# Patient Record
Sex: Male | Born: 1975 | Race: White | Hispanic: No | Marital: Married | Smoking: Never smoker
Health system: Southern US, Community
[De-identification: ages and names within clinical notes are randomized; demographics above are authoritative.]

## PROBLEM LIST (undated history)

## (undated) DIAGNOSIS — N200 Calculus of kidney: Secondary | ICD-10-CM

## (undated) HISTORY — PX: FRACTURE SURGERY: SHX138

---

## 2000-01-16 ENCOUNTER — Emergency Department (HOSPITAL_COMMUNITY): Admission: EM | Admit: 2000-01-16 | Discharge: 2000-01-16 | Payer: Self-pay | Admitting: Emergency Medicine

## 2005-06-27 ENCOUNTER — Emergency Department (HOSPITAL_COMMUNITY): Admission: EM | Admit: 2005-06-27 | Discharge: 2005-06-27 | Payer: Self-pay | Admitting: Emergency Medicine

## 2006-06-03 ENCOUNTER — Emergency Department (HOSPITAL_COMMUNITY): Admission: EM | Admit: 2006-06-03 | Discharge: 2006-06-03 | Payer: Self-pay | Admitting: Emergency Medicine

## 2007-07-04 ENCOUNTER — Emergency Department: Payer: Self-pay | Admitting: Emergency Medicine

## 2014-02-09 ENCOUNTER — Ambulatory Visit: Payer: Self-pay | Admitting: Medical

## 2014-04-24 ENCOUNTER — Emergency Department: Payer: Self-pay | Admitting: Emergency Medicine

## 2014-06-09 ENCOUNTER — Emergency Department: Payer: Self-pay | Admitting: Emergency Medicine

## 2014-06-09 LAB — URINALYSIS, COMPLETE
Bacteria: NONE SEEN
Bilirubin,UR: NEGATIVE
Glucose,UR: NEGATIVE mg/dL (ref 0–75)
Leukocyte Esterase: NEGATIVE
Nitrite: NEGATIVE
Ph: 5 (ref 4.5–8.0)
Protein: NEGATIVE
RBC,UR: 249 /HPF (ref 0–5)
Specific Gravity: 1.028 (ref 1.003–1.030)
Squamous Epithelial: <1
WBC UR: 2 /HPF (ref 0–5)

## 2014-06-09 LAB — CBC WITH DIFFERENTIAL/PLATELET
Basophil #: 0 10*3/uL (ref 0.0–0.1)
Basophil %: 0.5 %
Eosinophil #: 0.1 10*3/uL (ref 0.0–0.7)
Eosinophil %: 1 %
HCT: 43.8 % (ref 40.0–52.0)
HGB: 14.5 g/dL (ref 13.0–18.0)
Lymphocyte #: 1.6 10*3/uL (ref 1.0–3.6)
Lymphocyte %: 20.6 %
MCH: 30.3 pg (ref 26.0–34.0)
MCHC: 33 g/dL (ref 32.0–36.0)
MCV: 92 fL (ref 80–100)
Monocyte #: 0.4 x10 3/mm (ref 0.2–1.0)
Monocyte %: 4.6 %
Neutrophil #: 5.7 10*3/uL (ref 1.4–6.5)
Neutrophil %: 73.3 %
Platelet: 138 10*3/uL — ABNORMAL LOW (ref 150–440)
RBC: 4.77 10*6/uL (ref 4.40–5.90)
RDW: 12.8 % (ref 11.5–14.5)
WBC: 7.7 10*3/uL (ref 3.8–10.6)

## 2014-06-09 LAB — COMPREHENSIVE METABOLIC PANEL
Albumin: 4.1 g/dL (ref 3.4–5.0)
Alkaline Phosphatase: 63 U/L
Anion Gap: 6 — ABNORMAL LOW (ref 7–16)
BUN: 18 mg/dL (ref 7–18)
Bilirubin,Total: 0.6 mg/dL (ref 0.2–1.0)
Calcium, Total: 8.9 mg/dL (ref 8.5–10.1)
Chloride: 104 mmol/L (ref 98–107)
Co2: 31 mmol/L (ref 21–32)
Creatinine: 1.25 mg/dL (ref 0.60–1.30)
EGFR (African American): >60
EGFR (Non-African Amer.): >60
Glucose: 128 mg/dL — ABNORMAL HIGH (ref 65–99)
Osmolality: 285 (ref 275–301)
Potassium: 4.1 mmol/L (ref 3.5–5.1)
SGOT(AST): 39 U/L — ABNORMAL HIGH (ref 15–37)
SGPT (ALT): 45 U/L
Sodium: 141 mmol/L (ref 136–145)
Total Protein: 7.2 g/dL (ref 6.4–8.2)

## 2015-02-28 ENCOUNTER — Emergency Department: Payer: 59

## 2015-02-28 ENCOUNTER — Emergency Department
Admission: EM | Admit: 2015-02-28 | Discharge: 2015-02-28 | Disposition: A | Discharge status: Home / Self Care | Payer: 59 | Attending: Emergency Medicine | Admitting: Emergency Medicine

## 2015-02-28 DIAGNOSIS — N23 Unspecified renal colic: Secondary | ICD-10-CM | POA: Diagnosis not present

## 2015-02-28 DIAGNOSIS — R109 Unspecified abdominal pain: Secondary | ICD-10-CM | POA: Diagnosis present

## 2015-02-28 HISTORY — DX: Calculus of kidney: N20.0

## 2015-02-28 LAB — COMPREHENSIVE METABOLIC PANEL
ALT: 27 U/L (ref 17–63)
ANION GAP: 10 (ref 5–15)
AST: 28 U/L (ref 15–41)
Albumin: 4.3 g/dL (ref 3.5–5.0)
Alkaline Phosphatase: 51 U/L (ref 38–126)
BUN: 12 mg/dL (ref 6–20)
CHLORIDE: 103 mmol/L (ref 101–111)
CO2: 27 mmol/L (ref 22–32)
CREATININE: 1.16 mg/dL (ref 0.61–1.24)
Calcium: 9.5 mg/dL (ref 8.9–10.3)
GFR calc non Af Amer: >60 mL/min (ref >60)
Glucose, Bld: 140 mg/dL — ABNORMAL HIGH (ref 65–99)
Potassium: 4 mmol/L (ref 3.5–5.1)
Sodium: 140 mmol/L (ref 135–145)
Total Bilirubin: 0.8 mg/dL (ref 0.3–1.2)
Total Protein: 6.9 g/dL (ref 6.5–8.1)

## 2015-02-28 LAB — CBC WITH DIFFERENTIAL/PLATELET
Basophils Absolute: 0 10*3/uL (ref 0–0.1)
Basophils Relative: 0 %
Eosinophils Absolute: 0.1 10*3/uL (ref 0–0.7)
Eosinophils Relative: 1 %
HCT: 43.5 % (ref 40.0–52.0)
Hemoglobin: 15.2 g/dL (ref 13.0–18.0)
Lymphocytes Relative: 52 %
Lymphs Abs: 3.6 10*3/uL (ref 1.0–3.6)
MCH: 32.1 pg (ref 26.0–34.0)
MCHC: 35 g/dL (ref 32.0–36.0)
MCV: 91.9 fL (ref 80.0–100.0)
MONOS PCT: 7 %
Monocytes Absolute: 0.5 10*3/uL (ref 0.2–1.0)
Neutro Abs: 2.9 10*3/uL (ref 1.4–6.5)
Neutrophils Relative %: 40 %
Platelets: 161 10*3/uL (ref 150–440)
RBC: 4.74 MIL/uL (ref 4.40–5.90)
RDW: 13.3 % (ref 11.5–14.5)
WBC: 7.1 10*3/uL (ref 3.8–10.6)

## 2015-02-28 LAB — URINALYSIS COMPLETE WITH MICROSCOPIC (ARMC ONLY)
Bilirubin Urine: NEGATIVE
Glucose, UA: NEGATIVE mg/dL
Hgb urine dipstick: NEGATIVE
Ketones, ur: NEGATIVE mg/dL
LEUKOCYTES UA: NEGATIVE
Nitrite: NEGATIVE
Protein, ur: NEGATIVE mg/dL
SPECIFIC GRAVITY, URINE: 1.026 (ref 1.005–1.030)
pH: 5 (ref 5.0–8.0)

## 2015-02-28 MED ORDER — KETOROLAC TROMETHAMINE 30 MG/ML IJ SOLN
15.0000 mg | Freq: Once | INTRAMUSCULAR | Status: AC
  Administered 2015-02-28: 15 mg via INTRAVENOUS
  Filled 2015-02-28: qty 1

## 2015-02-28 MED ORDER — KETOROLAC TROMETHAMINE 30 MG/ML IJ SOLN
INTRAMUSCULAR | Status: AC
  Administered 2015-02-28: 30 mg via INTRAVENOUS
  Filled 2015-02-28: qty 1

## 2015-02-28 MED ORDER — HYDROMORPHONE HCL 1 MG/ML IJ SOLN
INTRAMUSCULAR | Status: AC
  Administered 2015-02-28: 0.5 mg via INTRAVENOUS
  Filled 2015-02-28: qty 1

## 2015-02-28 MED ORDER — HYDROMORPHONE HCL 1 MG/ML IJ SOLN
INTRAMUSCULAR | Status: AC
  Administered 2015-02-28: 1 mg via INTRAVENOUS
  Filled 2015-02-28: qty 1

## 2015-02-28 MED ORDER — SODIUM CHLORIDE 0.9 % IV SOLN
1000.0000 mL | Freq: Once | INTRAVENOUS | Status: AC
Start: 2015-02-28 — End: 2015-02-28
  Administered 2015-02-28: 1000 mL via INTRAVENOUS

## 2015-02-28 MED ORDER — OXYCODONE-ACETAMINOPHEN 5-325 MG PO TABS: 1.0000 | ORAL_TABLET | Freq: Four times a day (QID) | ORAL | Status: DC | PRN

## 2015-02-28 MED ORDER — HYDROMORPHONE HCL 1 MG/ML IJ SOLN
1.0000 mg | Freq: Once | INTRAMUSCULAR | Status: AC
  Administered 2015-02-28: 1 mg via INTRAVENOUS

## 2015-02-28 MED ORDER — TAMSULOSIN HCL 0.4 MG PO CAPS: 0.4000 mg | ORAL_CAPSULE | Freq: Every day | ORAL | Status: DC

## 2015-02-28 MED ORDER — ONDANSETRON HCL 4 MG PO TABS: 4.0000 mg | ORAL_TABLET | Freq: Every day | ORAL | Status: DC | PRN

## 2015-02-28 MED ORDER — ONDANSETRON HCL 4 MG/2ML IJ SOLN
4.0000 mg | Freq: Once | INTRAMUSCULAR | Status: AC
  Administered 2015-02-28: 4 mg via INTRAVENOUS

## 2015-02-28 MED ORDER — ONDANSETRON HCL 4 MG/2ML IJ SOLN
INTRAMUSCULAR | Status: AC
  Administered 2015-02-28: 4 mg via INTRAVENOUS
  Filled 2015-02-28: qty 2

## 2015-02-28 MED ORDER — SODIUM CHLORIDE 0.9 % IV SOLN
Freq: Once | INTRAVENOUS | Status: AC
  Administered 2015-02-28: 1000 mL via INTRAVENOUS

## 2015-02-28 MED ORDER — HYDROMORPHONE HCL 1 MG/ML IJ SOLN
INTRAMUSCULAR | Status: AC
  Filled 2015-02-28: qty 1

## 2015-02-28 MED ORDER — OXYCODONE-ACETAMINOPHEN 5-325 MG PO TABS
2.0000 | ORAL_TABLET | Freq: Once | ORAL | Status: AC
  Administered 2015-02-28: 2 via ORAL
  Filled 2015-02-28: qty 2

## 2015-02-28 MED ORDER — HYDROMORPHONE HCL 1 MG/ML IJ SOLN
0.5000 mg | Freq: Once | INTRAMUSCULAR | Status: AC
  Administered 2015-02-28: 0.5 mg via INTRAVENOUS

## 2015-02-28 MED ORDER — KETOROLAC TROMETHAMINE 30 MG/ML IJ SOLN
30.0000 mg | Freq: Once | INTRAMUSCULAR | Status: AC
  Administered 2015-02-28: 30 mg via INTRAVENOUS

## 2015-02-28 NOTE — Discharge Instructions (Signed)

## 2015-02-28 NOTE — ED Notes (Signed)
Pt placed on med hold at this time, pt and family made aware, verbalized understanding, no further needs at this time

## 2015-02-28 NOTE — ED Notes (Signed)
Patient transported to CT 

## 2015-02-28 NOTE — ED Notes (Signed)
Pt placed on 2L [place] due to 90% on RA while resting after pain medication given

## 2015-02-28 NOTE — ED Notes (Signed)
Pt c/o right flank pain since waking around 7am with nausea with a hx of same on the left.

## 2015-02-28 NOTE — ED Provider Notes (Signed)
[facility] Emergency Department Provider Note     Time seen: ----------------------------------------- 7:53 AM on 02/28/2015 -----------------------------------------    I have reviewed the triage vital signs and the nursing notes.   HISTORY  Chief Complaint Flank Pain    HPI Fernando LackBrian Rogers is a 39 y.o. male who presents ER with severe sudden right flank pain that started about 7 AM with nausea. Pain is sharp and stabbing in the right lower quadrant also hurts in the head of his penis. Patient reports about 6 months or had a kidney stone in the left side. Nothing makes his symptoms better or worse this time. He was able to pass his last kidney stone.   No past medical history on file.  There are no active problems to display for this patient.   No past surgical history on file.  Allergies Review of patient's allergies indicates not on file.  Social History History  Substance Use Topics  . Smoking status: Not on file  . Smokeless tobacco: Not on file  . Alcohol Use: Not on file    Review of Systems Constitutional: Negative for fever. Eyes: Negative for visual changes. ENT: Negative for sore throat. Cardiovascular: Negative for chest pain. Respiratory: Negative for shortness of breath. Gastrointestinal: Positive for abdominal pain and vomiting Genitourinary: Negative for dysuria. Musculoskeletal: Negative for back pain. Skin: Negative for rash. Neurological: Negative for headaches, focal weakness or numbness.  10-point ROS otherwise negative.  ____________________________________________   PHYSICAL EXAM:  VITAL SIGNS: ED Triage Vitals  Enc Vitals Group     BP --      Pulse --      Resp --      Temp --      Temp src --      SpO2 --      Weight --      Height --      Head Cir --      Peak Flow --      Pain Score --      Pain Loc --      Pain Edu? --      Excl. in GC? --     Constitutional: Alert and oriented. Moderate  distress Eyes: Conjunctivae are normal. PERRL. Normal extraocular movements. ENT   Head: Normocephalic and atraumatic.   Nose: No congestion/rhinnorhea.   Mouth/Throat: Mucous membranes are moist.   Neck: No stridor. Hematological/Lymphatic/Immunilogical: No cervical lymphadenopathy. Cardiovascular: Normal rate, regular rhythm. Normal and symmetric distal pulses are present in all extremities. No murmurs, rubs, or gallops. Respiratory: Normal respiratory effort without tachypnea nor retractions. Breath sounds are clear and equal bilaterally. No wheezes/rales/rhonchi. Gastrointestinal: Right flank tenderness, normal bowel sounds. Musculoskeletal: Nontender with normal range of motion in all extremities. No joint effusions.  No lower extremity tenderness nor edema. Neurologic:  Normal speech and language. No gross focal neurologic deficits are appreciated. Speech is normal. No gait instability. Skin:  Skin is warm, dry and intact. No rash noted. Psychiatric: Mood and affect are normal. Speech and behavior are normal. Patient exhibits appropriate insight and judgment. ____________________________________________  ED COURSE:  Pertinent labs & imaging results that were available during my care of the patient were reviewed by me and considered in my medical decision making (see chart for details). Patient being given saline, Toradol, Dilaudid, Zofran. ____________________________________________    LABS (pertinent positives/negatives)  Labs Reviewed  COMPREHENSIVE METABOLIC PANEL - Abnormal; Notable for the following:    Glucose, Bld 140 (*)    All  other components within normal limits  CBC WITH DIFFERENTIAL/PLATELET  URINALYSIS COMPLETEWITH MICROSCOPIC ([facility] ONLY)    RADIOLOGY  KUB with:  IMPRESSION: 1. 2 mm faint calcific density projected the right mid ureter. This could represent a tiny mid right ureteral stone.  2. Cannot exclude 3 mm stone lower right ureter at  the level of the upper sacrum. This could represent a tiny bone island .  3. Cannot exclude distal right ureteral or bladder 3 mm calcified stone .  IMPRESSION: 4 mm distal ureteral stone on the right with mild hydronephrosis and hydroureter.  Small nonobstructing renal stones bilaterally.  ____________________________________________  FINAL ASSESSMENT AND PLAN  Renal colic  Plan: Patient with labs and imaging as dictated above. Patient was given the above dictated medications. Patient is feeling better, will be discharged with pain medication as well as Flomax. He'll be referred to urology as outpatient follow-up   Emily Filbert, MD   Emily Filbert, MD 02/28/15 [phone]

## 2015-02-28 NOTE — ED Notes (Signed)
Pt alert and oriented X4, active, cooperative, pt in NAD. RR even and unlabored, color WNL.  Pt informed to return if any life threatening symptoms occur.   

## 2015-02-28 NOTE — ED Notes (Signed)
Pt encouraged to urinate, explained need for sample. Pt attempting

## 2015-03-01 ENCOUNTER — Institutional Professional Consult (permissible substitution): Payer: Self-pay | Admitting: Medical

## 2015-03-02 ENCOUNTER — Institutional Professional Consult (permissible substitution): Payer: Self-pay | Admitting: Medical

## 2015-03-08 ENCOUNTER — Encounter: Payer: Self-pay | Admitting: Medical

## 2015-03-08 ENCOUNTER — Telehealth: Payer: Self-pay | Admitting: Medical

## 2015-03-08 ENCOUNTER — Ambulatory Visit (INDEPENDENT_AMBULATORY_CARE_PROVIDER_SITE_OTHER): Payer: 59 | Admitting: Medical

## 2015-03-08 VITALS — BP 112/70 | HR 73 | Temp 98.1°F | Wt 166.0 lb

## 2015-03-08 DIAGNOSIS — R4586 Emotional lability: Secondary | ICD-10-CM

## 2015-03-08 DIAGNOSIS — R454 Irritability and anger: Secondary | ICD-10-CM

## 2015-03-08 DIAGNOSIS — F39 Unspecified mood [affective] disorder: Secondary | ICD-10-CM

## 2015-03-08 DIAGNOSIS — Z638 Other specified problems related to primary support group: Secondary | ICD-10-CM
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
## 2015-03-08 NOTE — Telephone Encounter (Signed)
Refer to psychologist at [facility], preferably a male

## 2015-03-08 NOTE — Telephone Encounter (Signed)
See message below.  Make sure the person we refer to does eval and counseling.  If not, then refer to counselor, Marjie SkiffLauren Atkinson or a male.

## 2015-03-15 ENCOUNTER — Other Ambulatory Visit: Payer: Self-pay | Admitting: Family Medicine

## 2015-03-15 DIAGNOSIS — R454 Irritability and anger: Secondary | ICD-10-CM

## 2015-03-15 DIAGNOSIS — F39 Unspecified mood [affective] disorder: Secondary | ICD-10-CM

## 2015-03-15 NOTE — Telephone Encounter (Signed)
I put orders in EPIC to [employer]

## 2015-09-30 ENCOUNTER — Telehealth: Payer: Self-pay | Admitting: Medical

## 2015-09-30 NOTE — Telephone Encounter (Signed)
We have a signed records release for pt's counselor but we need the name of his counselor to complete the release. I tried to reach pt by phone & unable to leave a message

## 2015-10-04 NOTE — Telephone Encounter (Signed)
Tried to reach pt again regarding release and call was dropped

## 2015-11-16 ENCOUNTER — Encounter: Payer: Self-pay | Admitting: Medical

## 2015-11-16 ENCOUNTER — Ambulatory Visit (INDEPENDENT_AMBULATORY_CARE_PROVIDER_SITE_OTHER): Payer: 59 | Admitting: Medical

## 2015-11-16 VITALS — BP 130/78 | HR 90 | Temp 99.2°F | Resp 16 | Wt 175.0 lb

## 2015-11-16 DIAGNOSIS — J069 Acute upper respiratory infection, unspecified: Secondary | ICD-10-CM | POA: Diagnosis not present

## 2015-11-16 DIAGNOSIS — R6889 Other general symptoms and signs: Secondary | ICD-10-CM

## 2015-11-16 LAB — POC INFLUENZA A&B (BINAX/QUICKVUE)
INFLUENZA A, POC: NEGATIVE
Influenza B, POC: NEGATIVE

## 2015-11-16 MED ORDER — HYDROCODONE-HOMATROPINE 5-1.5 MG/5ML PO SYRP: 5.0000 mL | ORAL_SOLUTION | Freq: Three times a day (TID) | ORAL | Status: AC | PRN

## 2015-11-16 NOTE — Progress Notes (Signed)
Subjective:  Fernando Rogers is a 40 y.o. male who presents for possible influenza.  Was at work yesterday, started coughing, throughout the day started having body aches.  By late afternoon left work not feeling well.  Overnight, felt worse.  Today has headache, coughing a lot, low back hurts. Has had temp up to 100.6  Has had chill.  No sore throat, no ear pain.  No ticks bites, no rash.   Getting some phlegm.   Has felt some SOB.   Son had a little cough this weekend.  Using some cough drops.   No other aggravating or relieving factors. No other complaint.  The following portions of the patient's history were reviewed and updated as appropriate: allergies, current medications, past medical history, past social history and problem list.  ROS as in subjective   Past Medical History  Diagnosis Date  . Kidney stones      Objective: BP 130/78 mmHg  Pulse 90  Temp(Src) 99.2 F (37.3 C) (Tympanic)  Resp 16  Wt 175 lb (79.379 kg)  General appearance: Alert, WD/WN, no distress, mildly ill appearing                             Skin: warm, no rash                           Head: no sinus tenderness                            Eyes: conjunctiva normal, corneas clear, PERRLA                            Ears: pearly TMs, external ear canals normal                          Nose: septum midline, turbinates swollen, with erythema and clear discharge             Mouth/throat: MMM, tongue normal, mild pharyngeal erythema                           Neck: supple, no adenopathy, no thyromegaly, non tender                          Heart: RRR, normal S1, S2, no murmurs                         Lungs: CTA bilaterally, no wheezes, rales, or rhonchi     Assessment  Encounter Diagnoses  Name Primary?  . Viral URI Yes  . Flu-like symptoms       Plan: Medications prescribed today: hycodan for prn use in the evening for worse cough.    Specific home care recommendations today include:  Only take  over-the-counter (OTC) or prescription medicines for pain, discomfort, or fever as directed by your caregiver.    Decongestant: You may use OTC Guaifenesin (Mucinex plain) for congestion.  You may use Pseudoephedrine (Sudafed) only if you don't have blood pressure problems or a diagnosis of hypertension.  Cough suppression: If you have cough from drainage, you may use over-the-counter Dextromethorphan (Delsym) as directed on the label  Sore throat remedies:  You may use  salt water gargles, warm fluids such as coffee or hot tea, or honey/tea/lemon mixture to sooth sore throat pain.  You may use OTC sore throat remedies such as Cepacol lozenges or Chloraseptic spray for sore throat pain.  Runny nose and sneezing remedies: You may use OTC antihistamine such as Zyrtec or Benadryl, but caution as these can cause drowsiness.    Pain/fever relief: You may use over-the-counter Tylenol for pain or fever  Drink extra fluids. Fluids help thin the mucus so your sinuses can drain more easily.   Applying either moist heat or ice packs to the sinus areas may help relieve discomfort.  Use saline nasal sprays to help moisten your sinuses. The sprays can be found at your local drugstore.   Patient was advised to call or return if worse or not improving in the next few days.    Patient voiced understanding of diagnosis, recommendations, and treatment plan.  Note given for work

## 2015-11-17 ENCOUNTER — Telehealth: Payer: Self-pay | Admitting: Family Medicine

## 2015-11-17 NOTE — Telephone Encounter (Signed)
Pt girlfriend called and states pt can barely swallow?  Do you want him to come in and have throat swabbed? Will you call in Rx [facility] to [facility] [place]? Will you allow pt to be out of work tomorrow also. Will you send complete out of work note showing yesterday through Friday to work fax [phone] to attn: Karleen HampshireSpencer & Karren BurlyDwight? Please call pt and advise [phone]

## 2015-11-17 NOTE — Telephone Encounter (Signed)
Tried to call pt but who ever answered hung up will try again later

## 2015-11-17 NOTE — Telephone Encounter (Signed)
Any fever, are tonsils swollen, throat cherry red, any ear pain?

## 2015-11-18 ENCOUNTER — Ambulatory Visit (INDEPENDENT_AMBULATORY_CARE_PROVIDER_SITE_OTHER): Payer: 59 | Admitting: Medical

## 2015-11-18 ENCOUNTER — Encounter: Payer: Self-pay | Admitting: Medical

## 2015-11-18 VITALS — BP 130/86 | HR 82 | Temp 99.4°F | Resp 18 | Wt 174.0 lb

## 2015-11-18 DIAGNOSIS — R059 Cough, unspecified: Secondary | ICD-10-CM

## 2015-11-18 DIAGNOSIS — J029 Acute pharyngitis, unspecified: Secondary | ICD-10-CM

## 2015-11-18 DIAGNOSIS — R252 Cramp and spasm: Secondary | ICD-10-CM

## 2015-11-18 DIAGNOSIS — R05 Cough: Secondary | ICD-10-CM

## 2015-11-18 DIAGNOSIS — J988 Other specified respiratory disorders: Secondary | ICD-10-CM | POA: Diagnosis not present

## 2015-11-18 DIAGNOSIS — E860 Dehydration: Secondary | ICD-10-CM | POA: Diagnosis not present

## 2015-11-18 MED ORDER — AZITHROMYCIN 500 MG PO TABS: ORAL_TABLET | ORAL | Status: AC

## 2015-11-18 NOTE — Progress Notes (Signed)
Subjective:  Fernando LackBrian Rogers is a 40 y.o. male who presents for recheck.  I saw him 2 days ago for same symptoms.   He feels worse, worse cough, legs achy, horrible sore throat.   Girlfriend who is with him again says he has only drank a little hot tea, and 1/3 of a Gatorade in the last 24 hours.    No NVD, no fever, no ear pain.  Using robitussin DM q4 hours, Hycodan QHS.   No other aggravating or relieving factors. No other complaint.  The following portions of the patient's history were reviewed and updated as appropriate: allergies, current medications, past medical history, past social history and problem list.  ROS as in subjective   Past Medical History  Diagnosis Date  . Kidney stones      Objective: BP 130/86 mmHg  Pulse 82  Temp(Src) 99.4 F (37.4 C) (Tympanic)  Resp 18  Wt 174 lb (78.926 kg)  General appearance: Alert, WD/WN, no distress,ill appearing                             Skin: warm, no rash                           Head: no sinus tenderness                            Eyes: conjunctiva normal, corneas clear, PERRLA                            Ears: pearly TMs, external ear canals normal                          Nose: septum midline, turbinates swollen, with erythema             Mouth/throat: MMM, tongue normal, mild pharyngeal erythema, +mucoid post nasal drainage                           Neck: supple, no adenopathy, no thyromegaly, non tender                          Heart: RRR, normal S1, S2, no murmurs                         Lungs: CTA bilaterally, no wheezes, rales, or rhonchi MSK: mild leg tenderness, no swelling, no deformity      Assessment  Encounter Diagnoses  Name Primary?  Marland Kitchen. Respiratory tract infection Yes  . Acute pharyngitis, unspecified etiology   . Cough   . Cramp of both lower extremities   . Dehydration       Plan: Discussed need to significantly increase water intake, such as 1L/hour until urinating clear urine.   advised rest,  hydration, begin Azithromycin, c/t Hycodan but can use q6-8 hours when not working.  Can alternate with robitussin DM by 3-4 hours.  Discussed signs of worsening dehydration.     Call/return if worse or not improving.      Fernando Rogers was seen today for sore throat.  Diagnoses and all orders for this visit:  Respiratory tract infection  Acute pharyngitis, unspecified etiology  Cough  Cramp of both lower  extremities  Dehydration  Other orders -     azithromycin (ZITHROMAX) 500 MG tablet; 1 tablet po daily x 3 days

## 2015-11-18 NOTE — Telephone Encounter (Signed)
Pt is coming in today for appt

## 2015-11-30 ENCOUNTER — Emergency Department
Admission: EM | Admit: 2015-11-30 | Discharge: 2015-12-01 | Disposition: A | Discharge status: Home / Self Care | Payer: 59 | Attending: Emergency Medicine | Admitting: Emergency Medicine

## 2015-11-30 ENCOUNTER — Encounter: Payer: Self-pay | Admitting: *Deleted

## 2015-11-30 ENCOUNTER — Emergency Department: Payer: 59

## 2015-11-30 DIAGNOSIS — K226 Gastro-esophageal laceration-hemorrhage syndrome: Secondary | ICD-10-CM | POA: Insufficient documentation

## 2015-11-30 DIAGNOSIS — R1110 Vomiting, unspecified: Secondary | ICD-10-CM | POA: Diagnosis present

## 2015-11-30 DIAGNOSIS — K2970 Gastritis, unspecified, without bleeding: Secondary | ICD-10-CM | POA: Diagnosis not present

## 2015-11-30 DIAGNOSIS — K920 Hematemesis: Secondary | ICD-10-CM | POA: Diagnosis not present

## 2015-11-30 DIAGNOSIS — N200 Calculus of kidney: Secondary | ICD-10-CM | POA: Diagnosis not present

## 2015-11-30 LAB — COMPREHENSIVE METABOLIC PANEL
ALT: 27 U/L (ref 17–63)
AST: 31 U/L (ref 15–41)
Albumin: 4.8 g/dL (ref 3.5–5.0)
Alkaline Phosphatase: 62 U/L (ref 38–126)
Anion gap: 5 (ref 5–15)
BUN: 18 mg/dL (ref 6–20)
CO2: 28 mmol/L (ref 22–32)
CREATININE: 1.09 mg/dL (ref 0.61–1.24)
Calcium: 9.5 mg/dL (ref 8.9–10.3)
Chloride: 105 mmol/L (ref 101–111)
GFR calc Af Amer: >60 mL/min (ref >60)
GFR calc non Af Amer: >60 mL/min (ref >60)
Glucose, Bld: 86 mg/dL (ref 65–99)
Potassium: 3.3 mmol/L — ABNORMAL LOW (ref 3.5–5.1)
Sodium: 138 mmol/L (ref 135–145)
Total Bilirubin: 0.7 mg/dL (ref 0.3–1.2)
Total Protein: 7.8 g/dL (ref 6.5–8.1)

## 2015-11-30 LAB — URINALYSIS COMPLETE WITH MICROSCOPIC (ARMC ONLY)
BACTERIA UA: NONE SEEN
BILIRUBIN URINE: NEGATIVE
GLUCOSE, UA: NEGATIVE mg/dL
HGB URINE DIPSTICK: NEGATIVE
KETONES UR: NEGATIVE mg/dL
Leukocytes, UA: NEGATIVE
NITRITE: NEGATIVE
Protein, ur: 30 mg/dL — AB
Specific Gravity, Urine: 1.03 (ref 1.005–1.030)
pH: 6 (ref 5.0–8.0)

## 2015-11-30 LAB — LIPASE, BLOOD: Lipase: 33 U/L (ref 11–51)

## 2015-11-30 MED ORDER — GI COCKTAIL ~~LOC~~
30.0000 mL | Freq: Once | ORAL | Status: AC
  Administered 2015-11-30: 30 mL via ORAL

## 2015-11-30 MED ORDER — GI COCKTAIL ~~LOC~~
ORAL | Status: AC
  Filled 2015-11-30: qty 30

## 2015-11-30 MED ORDER — PANTOPRAZOLE SODIUM 40 MG PO TBEC: 40.0000 mg | DELAYED_RELEASE_TABLET | Freq: Every day | ORAL | Status: AC

## 2015-11-30 NOTE — Discharge Instructions (Signed)
Gastritis, Adult Gastritis is soreness and puffiness (inflammation) of the lining of the stomach. If you do not get help, gastritis can cause bleeding and sores (ulcers) in the stomach. HOME CARE   Only take medicine as told by your doctor.  If you were given antibiotic medicines, take them as told. Finish the medicines even if you start to feel better.  Drink enough fluids to keep your pee (urine) clear or pale yellow.  Avoid foods and drinks that make your problems worse. Foods you may want to avoid include:  Caffeine or alcohol.  Chocolate.  Mint.  Garlic and onions.  Spicy foods.  Citrus fruits, including oranges, lemons, or limes.  Food containing tomatoes, including sauce, chili, salsa, and pizza.  Fried and fatty foods.  Eat small meals throughout the day instead of large meals. GET HELP RIGHT AWAY IF:   You have black or dark red poop (stools).  You throw up (vomit) blood. It may look like coffee grounds.  You cannot keep fluids down.  Your belly (abdominal) pain gets worse.  You have a fever.  You do not feel better after 1 week.  You have any other questions or concerns. MAKE SURE YOU:   Understand these instructions.  Will watch your condition.  Will get help right away if you are not doing well or get worse.   This information is not intended to replace advice given to you by your health care provider. Make sure you discuss any questions you have with your health care provider.   Document Released: 01/23/2008 Document Revised: 10/29/2011 Document Reviewed: 09/19/2011 Elsevier Interactive Patient Education 2016 [employer]  Mallory-Weiss Syndrome Mallory-Weiss syndrome refers to bleeding from tears in the lining of the tube that connects your throat to your stomach (esophagus). The tears occur at the entrance to your stomach. Usually the bleeding stops by itself after 24-48 hours. Surgery may be needed. This condition is not usually fatal.    CAUSES The tears that cause bleeding are often caused by severe or lasting vomiting or coughing.  RISK FACTORS  Abusing or drinking too much alcohol.  Having certain eating disorders, such as bulimia. SIGNS AND SYMPTOMS  Vomiting of bright red or black coffee-ground-like material.  Having black, tarry stools.  Fainting or experiencing loss of consciousness. DIAGNOSIS  The procedure often used to diagnose Mallory-Weiss syndrome is an esophagogastroduodenoscopy (EGD). During an EGD procedure, a small, flexible, tube-like telescope (endoscope) is put into your mouth, passed through your esophagus into your stomach, and then into your small bowel. An EGD will show your health care provider where the tear is.  TREATMENT  It is necessary to stop the bleeding as soon as possible. The possible treatments to stop the bleeding include injecting medicine into bleeding areas to block (clot) the blood vessels. If bleeding is significant, it may be necessary to replace the blood lost with a blood transfusion. HOME CARE INSTRUCTIONS Treat any conditions that may be causing the lasting or recurrent vomiting or coughing. This may include getting help for alcoholism or an eating disorder, if this applies. SEEK MEDICAL CARE IF:  You have nausea or vomiting.  SEEK IMMEDIATE MEDICAL CARE IF:  You have persistent dizziness, light-headedness, or fainting.  Your vomiting returns, or you have vomit that is bright red or looks like black coffee grounds.  You have bloody or black, tarry-looking stools.  You have chest pain.  You cannot eat or drink. MAKE SURE YOU:   Understand these instructions.  Will  watch your condition.  Will get help right away if you are not doing well or get worse.   This information is not intended to replace advice given to you by your health care provider. Make sure you discuss any questions you have with your health care provider.   Document Released: 12/24/2005 Document  Revised: 08/27/2014 Document Reviewed: 09/30/2013 Elsevier Interactive Patient Education [employer]

## 2015-11-30 NOTE — ED Provider Notes (Signed)
[facility] Emergency Department Provider Note  Time seen: 11:26 PM  I have reviewed the triage vital signs and the nursing notes.   HISTORY  Chief Complaint Emesis    HPI Fernando Rogers is a 40 y.o. male with no past medical history presents the emergency department after vomiting blood. According to the patient for the past 6 months every time he eats he will gag. He also states significant gastric reflux at night. Today after eating he states he gags several times and then vomited. The second or third time he vomited he noted blood in his vomitus well. This concerned the patient so he came to the emergency department for evaluation. As a secondary complaint of patient states for the past 2-3 weeks she has had a cough, he has seen his primary care physician for this cough, and states the cough is improving. Patient's only complaint currently is mild epigastric burning pain.     Past Medical History  Diagnosis Date  . Kidney stones     There are no active problems to display for this patient.   Past Surgical History  Procedure Laterality Date  . Fracture surgery      Current Outpatient Rx  Name  Route  Sig  Dispense  Refill  . azithromycin (ZITHROMAX) 500 MG tablet      1 tablet po daily x 3 days   3 tablet   0   . HYDROcodone-homatropine (HYCODAN) 5-1.5 MG/5ML syrup   Oral   Take 5 mLs by mouth every 8 (eight) hours as needed for cough.   120 mL   0     Allergies Penicillins  No family history on file.  Social History Social History  Substance Use Topics  . Smoking status: Never Smoker   . Smokeless tobacco: None  . Alcohol Use: No    Review of Systems Constitutional: Negative for fever. Cardiovascular: Negative for chest pain. Respiratory: Negative for shortness of breath. Gastrointestinal: Negative for abdominal pain Neurological: Negative for headache 10-point ROS otherwise  negative.  ____________________________________________   PHYSICAL EXAM:  VITAL SIGNS: ED Triage Vitals  Enc Vitals Group     BP 11/30/15 2109 140/81 mmHg     Pulse Rate 11/30/15 2109 67     Resp 11/30/15 2109 18     Temp 11/30/15 2109 98.8 F (37.1 C)     Temp Source 11/30/15 2109 Oral     SpO2 11/30/15 2109 98 %     Weight 11/30/15 2109 174 lb (78.926 kg)     Height 11/30/15 2109 5\' 6"  (1.676 m)     Head Cir --      Peak Flow --      Pain Score --      Pain Loc --      Pain Edu? --      Excl. in GC? --     Constitutional: Alert and oriented. Well appearing and in no distress. Eyes: Normal exam ENT   Head: Normocephalic and atraumatic.   Mouth/Throat: Mucous membranes are moist. Cardiovascular: Normal rate, regular rhythm. No murmur Respiratory: Normal respiratory effort without tachypnea nor retractions. Breath sounds are clear Gastrointestinal: Soft and nontender. No distention. Musculoskeletal: Nontender with normal range of motion in all extremities.  Neurologic:  Normal speech and language. No gross focal neurologic deficits  Skin:  Skin is warm, dry and intact.  Psychiatric: Mood and affect are normal. Speech and behavior are normal.  ____________________________________________   RADIOLOGY  Chest x-ray reviewed  and interpreted by myself shows no acute disease.   INITIAL IMPRESSION / ASSESSMENT AND PLAN / ED COURSE  Pertinent labs & imaging results that were available during my care of the patient were reviewed by me and considered in my medical decision making (see chart for details).  Patient presents the emergency department with epigastric burning discomfort area and states he has been gagging after eating for the past 6 months or so but tonight he actually vomited. He states the gagging is only with certain foods such as pizza. Denies alcohol or NSAID use besides for the past one week he has been occasionally using ibuprofen for his cough  discomfort. Patient's labs are within normal limits including lipase. Chest x-ray negative. Highly suspect gastritis as the cause of the patient's discomfort. We'll dose of GI cocktail and reevaluate. If symptoms are improved we will discharge on a PPI as well as Maalox. I discussed follow-up with GI medicine, patient is agreeable. Highly suspect the patient's bloody vomit was due to a Mallory-Weiss tear. Patient has no further vomiting or bloody vomit since.  Patient states discomfort is gone after GI cocktail. We'll discharge home with Protonix and Maalox.  ____________________________________________   FINAL CLINICAL IMPRESSION(S) / ED DIAGNOSES  Hematemesis Mallory-Weiss tear Gastritis   Minna Antis, MD 12/01/15 [phone]

## 2015-11-30 NOTE — ED Notes (Signed)
Pt states he had 1 occurrence of emesis with blood today after eating dinner. Pt states he has had a cough for several weeks.

## 2015-11-30 NOTE — ED Notes (Signed)
Pt reports after eating meals he gags.  Today, pt vomited after eating and saw blood in emesis.  No diarrhea.   Pt also has a cough.  Pt reports coughing up white phlegm. Pt alert.

## 2015-12-01 LAB — CBC
HEMATOCRIT: 43.2 % (ref 40.0–52.0)
Hemoglobin: 15.3 g/dL (ref 13.0–17.0)
MCH: 31.1 pg (ref 26.0–34.0)
MCHC: 35.5 g/dL (ref 32.0–36.0)
MCV: 87.6 fL (ref 80.0–100.0)
Platelets: 175 10*3/uL (ref 150–440)
RBC: 4.93 MIL/uL (ref 4.40–5.90)
RDW: 12.6 % (ref 11.5–14.5)
WBC: 5.9 10*3/uL (ref 3.8–10.6)

## 2015-12-01 MED ORDER — PANTOPRAZOLE SODIUM 40 MG PO TBEC
40.0000 mg | DELAYED_RELEASE_TABLET | Freq: Once | ORAL | Status: DC
Start: 2015-12-01 — End: 2015-12-01
  Filled 2015-12-01: qty 1

## 2016-05-24 IMAGING — CT CT RENAL STONE PROTOCOL
1 of 2 series · 5 of 32 positions shown, 10 images · non-contrast
Comparison: Plain film from earlier in the same day

CLINICAL DATA: Right flank pain since this morning

EXAM:
CT ABDOMEN AND PELVIS WITHOUT CONTRAST
TECHNIQUE: Multidetector CT imaging of the abdomen and pelvis was performed
following the standard protocol without IV contrast.

[Series 4: lung windows · axial · 0.63mm/px · z∈[-158,-78]mm · 5 of 24 slices shown, 10 images]
[im 4/24  soft-tissue]
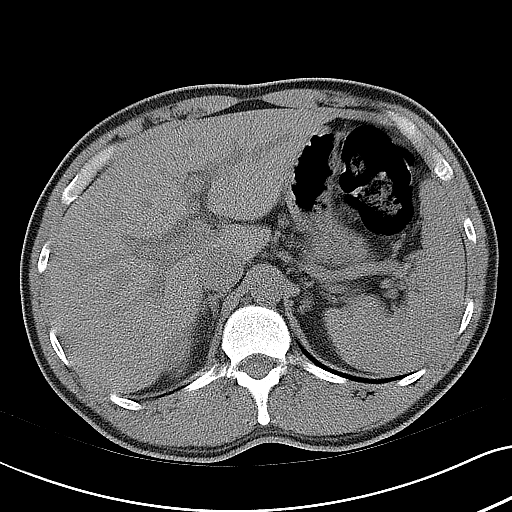
[im 4/24  bone]
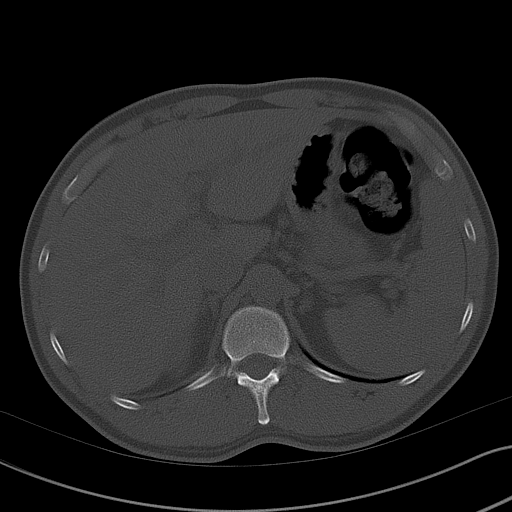
[im 8/24  soft-tissue]
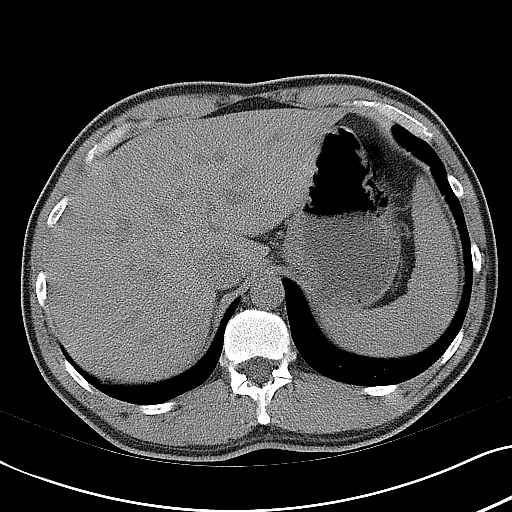
[im 8/24  lung]
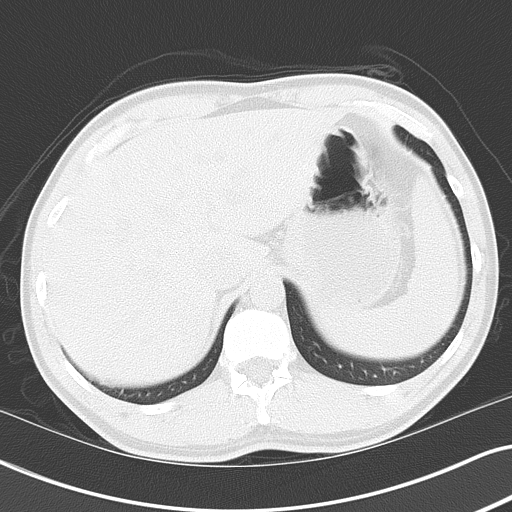
[im 12/24  soft-tissue]
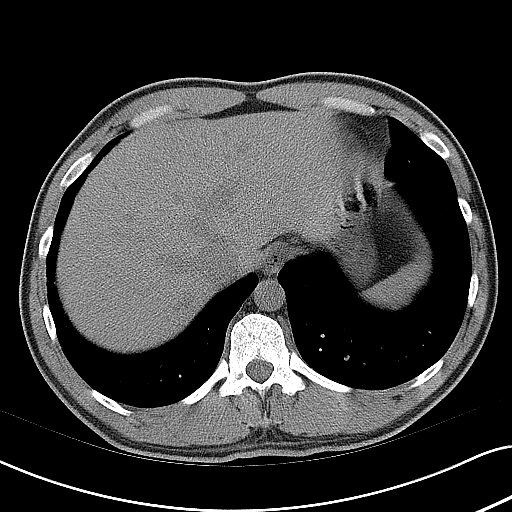
[im 12/24  lung]
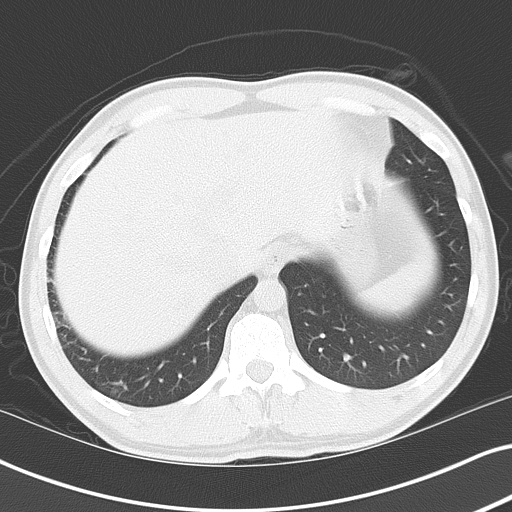
[im 16/24  soft-tissue]
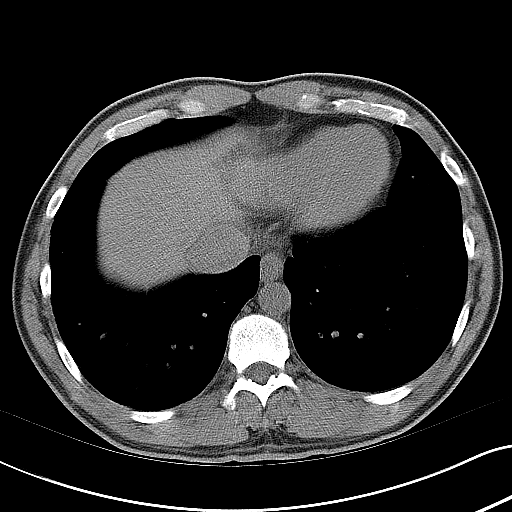
[im 16/24  lung]
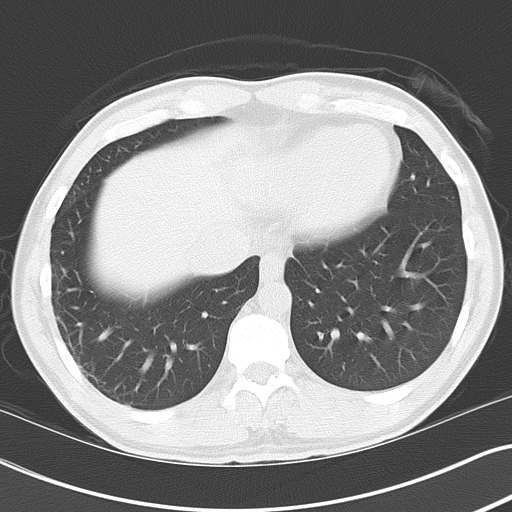
[im 20/24  soft-tissue]
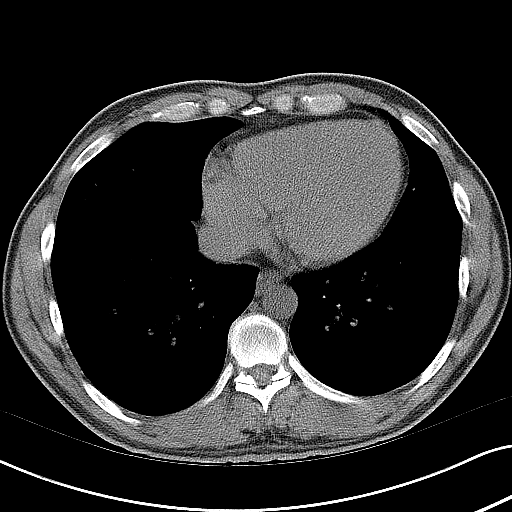
[im 20/24  lung]
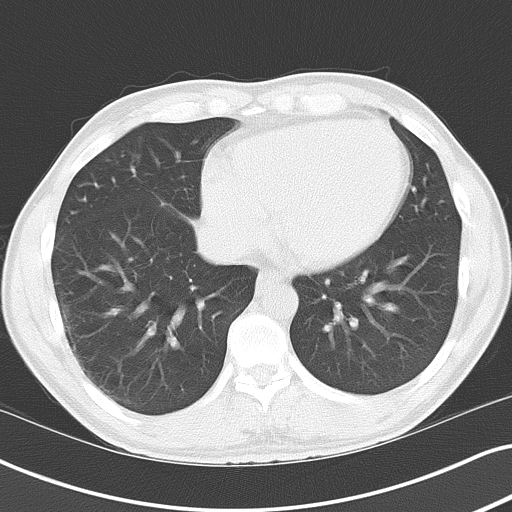

[5 of 32 positions shown; findings below may reference images not displayed]

FINDINGS: The lung bases are free of acute infiltrate or sizable effusion.

The liver, gallbladder, spleen, adrenal glands and pancreas are
within normal limits. The kidneys are well visualized bilaterally. A
nonobstructing 2 mm stone is noted in the upper pole of the left
kidney. The right kidney shows mild hydronephrosis and a few fleck
like nonobstructing stones. The prominence of the collecting system
extends to the ureterovesical junction at which point a 4 mm stone
is noted. This corresponds to that seen on the recent plain film.
The density seen in the expected region of the mid and mid to distal
ureters are not Kon Khmer out on this exam.

The appendix is well visualized and within normal limits. The
bladder is partially distended. No pelvic mass lesion is seen. The
osseous structures show no acute abnormality.
IMPRESSION: 4 mm distal ureteral stone on the right with mild hydronephrosis and
hydroureter.

Small nonobstructing renal stones bilaterally.

## 2017-02-23 IMAGING — CR DG CHEST 2V
2 series · 2 of 2 positions shown · non-contrast
Comparison: Chest radiograph performed 06/27/2005

CLINICAL DATA: Acute onset of hematemesis. Subacute onset of
productive cough. Initial encounter.

EXAM:
CHEST  2 VIEW

[chest pa]
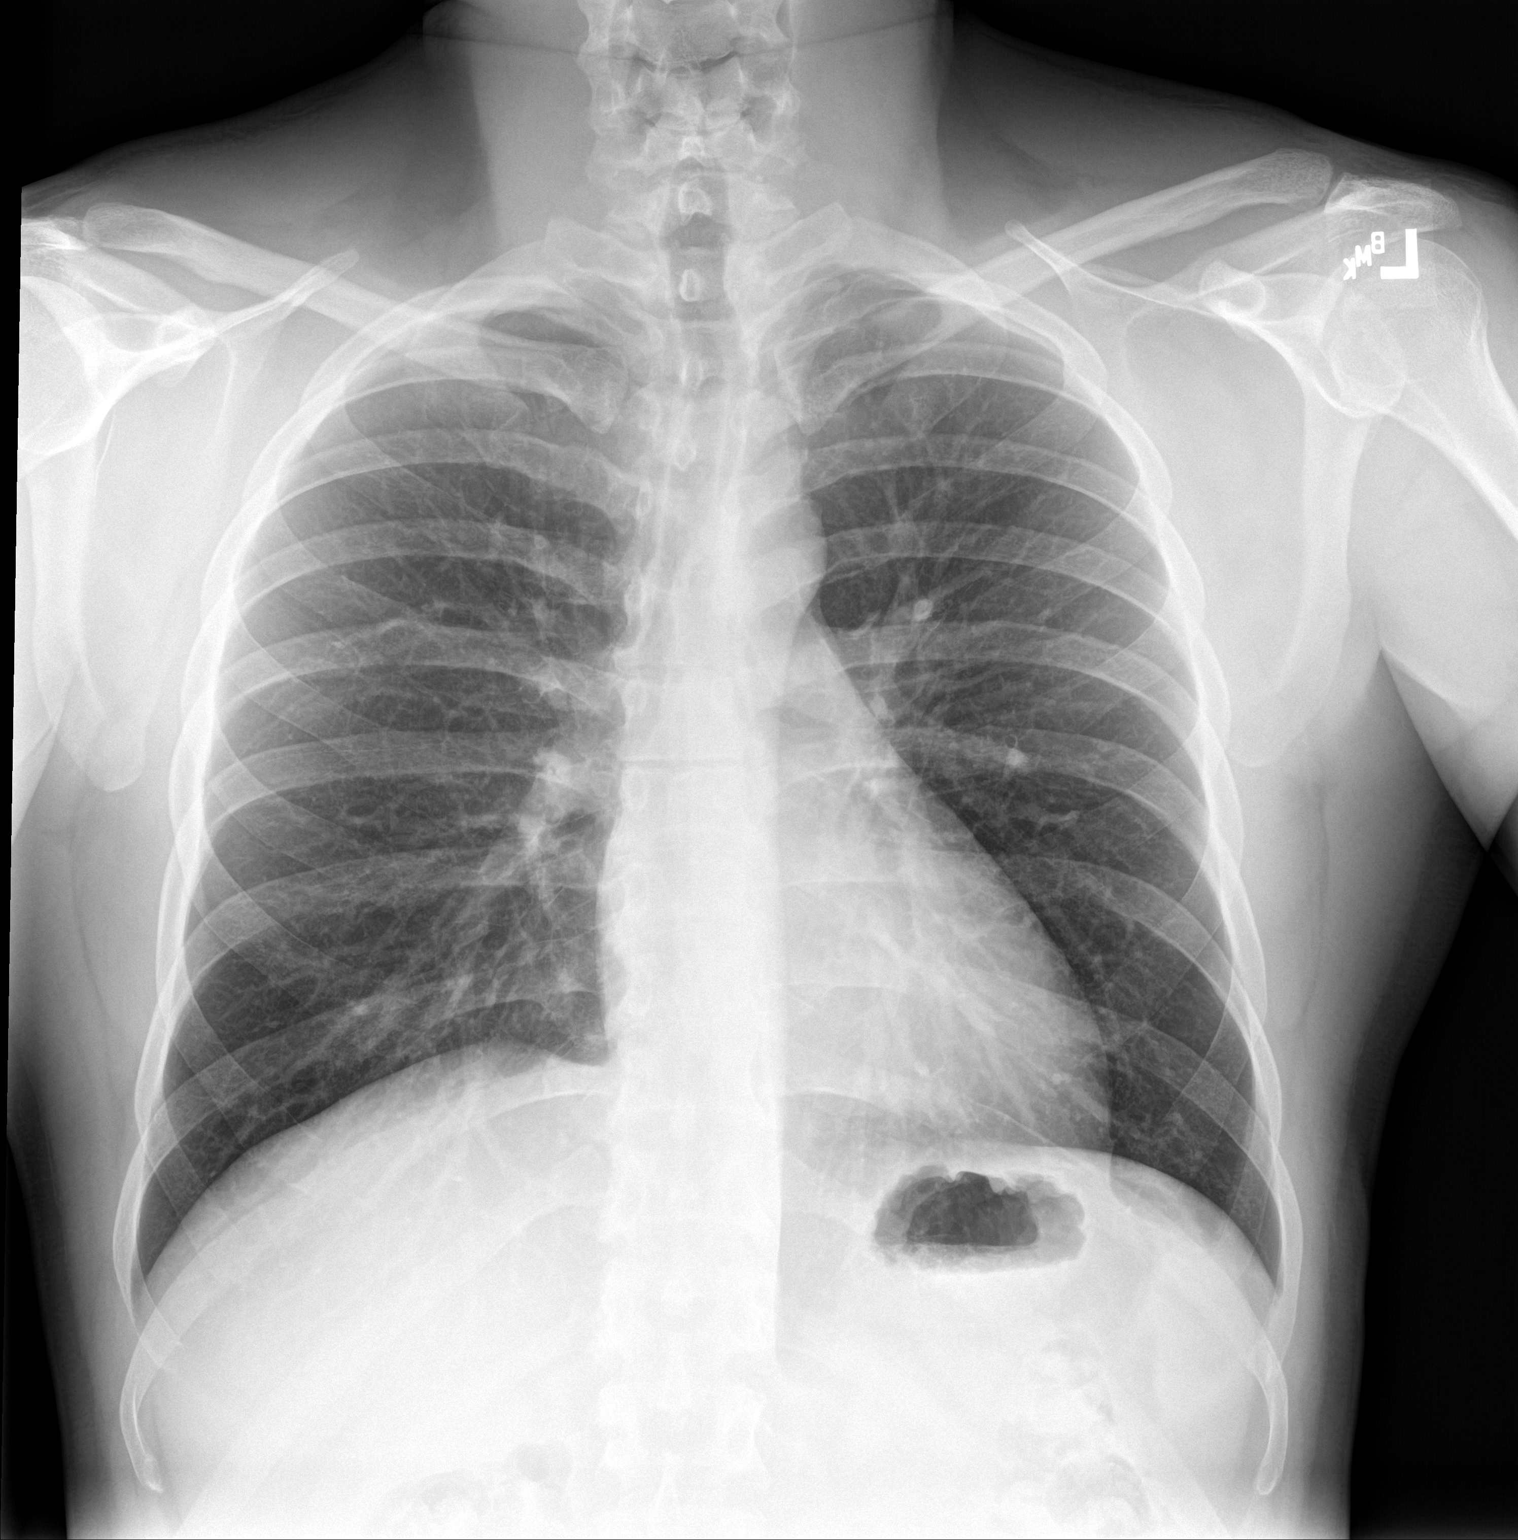

[chest lat]
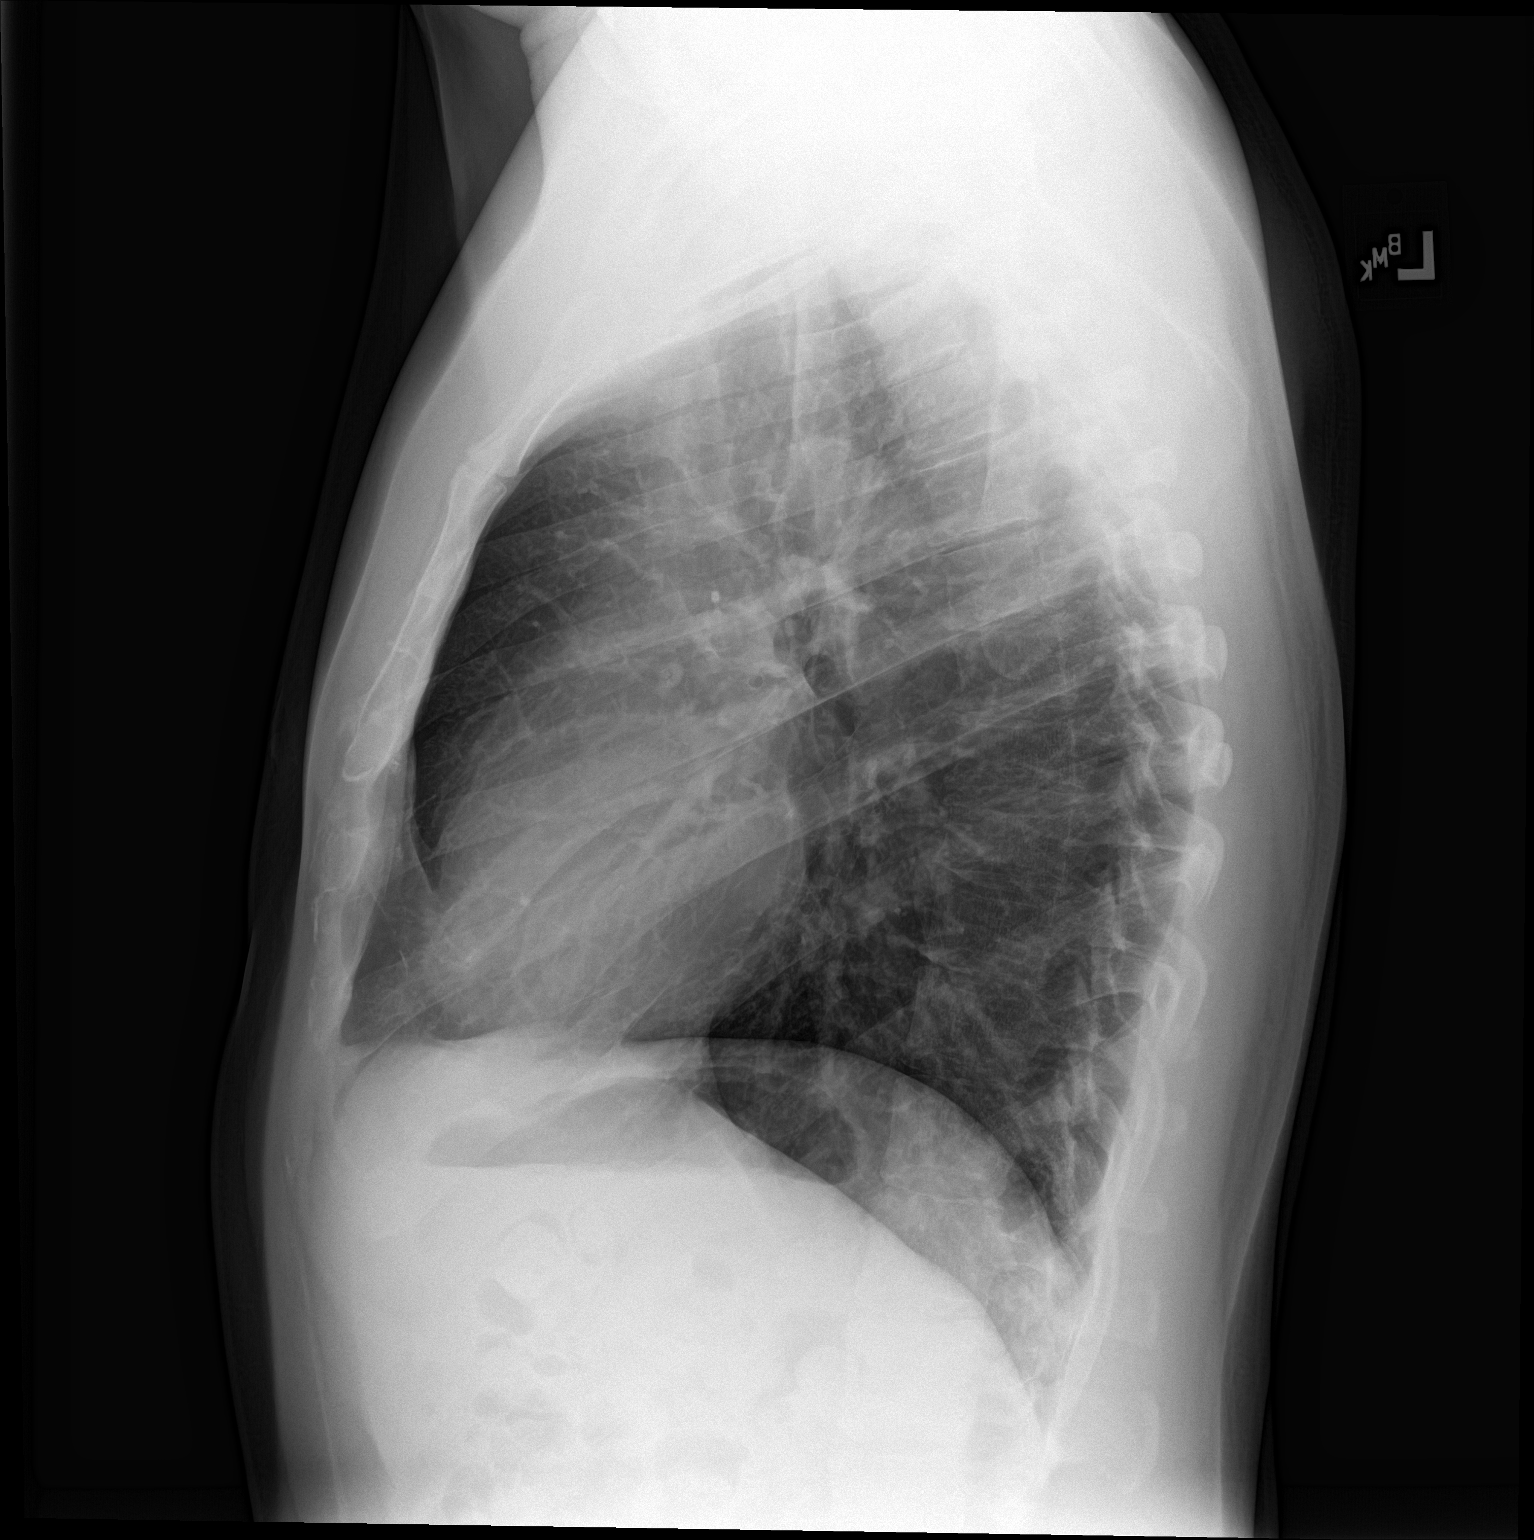

[2 of 2 positions shown; findings below may reference images not displayed]

FINDINGS: The lungs are well-aerated and clear. There is no evidence of focal
opacification, pleural effusion or pneumothorax.

The heart is normal in size; the mediastinal contour is within
normal limits. No acute osseous abnormalities are seen.
IMPRESSION: No acute cardiopulmonary process seen.

## 2020-08-29 ENCOUNTER — Other Ambulatory Visit: Payer: Self-pay

## 2020-08-29 ENCOUNTER — Encounter (HOSPITAL_COMMUNITY): Payer: Self-pay

## 2020-08-29 ENCOUNTER — Ambulatory Visit (HOSPITAL_COMMUNITY)
Admission: EM | Admit: 2020-08-29 | Discharge: 2020-08-29 | Disposition: A | Discharge status: Home / Self Care | Payer: HRSA Program | Attending: Student | Admitting: Student

## 2020-08-29 DIAGNOSIS — J069 Acute upper respiratory infection, unspecified: Secondary | ICD-10-CM

## 2020-08-29 DIAGNOSIS — Z20822 Contact with and (suspected) exposure to covid-19: Secondary | ICD-10-CM | POA: Diagnosis not present

## 2020-08-29 DIAGNOSIS — J029 Acute pharyngitis, unspecified: Secondary | ICD-10-CM | POA: Diagnosis not present

## 2020-08-29 DIAGNOSIS — R059 Cough, unspecified: Secondary | ICD-10-CM | POA: Insufficient documentation

## 2020-08-29 LAB — SARS CORONAVIRUS 2 (TAT 6-24 HRS): SARS Coronavirus 2: NEGATIVE

## 2020-08-29 MED ORDER — PROMETHAZINE-DM 6.25-15 MG/5ML PO SYRP
5.0000 mL | ORAL_SOLUTION | Freq: Four times a day (QID) | ORAL | 0 refills | Status: AC | PRN
Start: 2020-08-29 — End: ?

## 2020-08-29 MED ORDER — BENZONATATE 100 MG PO CAPS: 100.0000 mg | ORAL_CAPSULE | Freq: Three times a day (TID) | ORAL | 0 refills | Status: AC

## 2020-08-29 NOTE — ED Triage Notes (Signed)
Pt in with c/o cough, ST and sneezing that has been going on since Friday  Pt has not had medication for sxs

## 2020-08-29 NOTE — Discharge Instructions (Addendum)
-  Tessalon as needed for cough. Take one pill up to 3x daily (every 8 hours) °-Promethazine DM cough syrup for congestion/cough. This could make you drowsy, so take at night before bed. °-For fevers/chills, body aches, headaches- use Tylenol and Ibuprofen. You can alternate these for maximum effect. Use up to 3000mg Tylenol daily and 3200mg Ibuprofen daily. Make sure to take ibuprofen with food. Check the bottle of ibuprofen/tylenol for specific dosage instructions. ° °We are currently awaiting result of your PCR covid-19 test. This typically comes back in 1-2 days. We'll call you if the result is positive. Otherwise, the result will be sent electronically to your MyChart. You can also call this clinic and ask for your result via telephone.  ° °Please isolate at home while awaiting these results. If your test is positive for Covid-19, continue to isolate at home for 5 days if you have mild symptoms, or a total of 10 days from symptom onset if you have more severe symptoms. If you quarantine for a shorter period of time (i.e. 5 days), make sure to wear a mask until day 10 of symptoms. Treat your symptoms at home with OTC remedies like tylenol/ibuprofen, mucinex, nyquil, etc. Seek medical attention if you develop high fevers, chest pain, shortness of breath, ear pain, facial pain, etc. Make sure to get up and move around every 2-3 hours while convalescing to help prevent blood clots. Drink plenty of fluids, and rest as much as possible. ° °

## 2020-08-29 NOTE — ED Provider Notes (Signed)
[facility]    CSN: [id number] Arrival date & time: 08/29/20  4097      History   Chief Complaint Chief Complaint  Patient presents with  . Cough  . Sore Throat  . sneezing    HPI Fernando Rogers is a 45 y.o. male Presenting for URI symptoms for 4 days- cough, sore throat, sneezing. History of kidney stones.has not taken anything for symptoms. Denies fevers/chills, n/v/d, shortness of breath, chest pain,  facial pain, teeth pain, headaches, loss of taste/smell, swollen lymph nodes, ear pain.  Denies chest pain, shortness of breath, confusion, high fevers.  Partially vaccinated for covid-19.   HPI  Past Medical History:  Diagnosis Date  . Kidney stones     There are no problems to display for this patient.   Past Surgical History:  Procedure Laterality Date  . FRACTURE SURGERY         Home Medications    Prior to Admission medications   Medication Sig Start Date End Date Taking? Authorizing Provider  benzonatate (TESSALON) 100 MG capsule Take 1 capsule (100 mg total) by mouth every 8 (eight) hours. 08/29/20  Yes Rhys Martini, PA-C  promethazine-dextromethorphan (PROMETHAZINE-DM) 6.25-15 MG/5ML syrup Take 5 mLs by mouth 4 (four) times daily as needed for cough. 08/29/20  Yes Rhys Martini, PA-C  azithromycin (ZITHROMAX) 500 MG tablet 1 tablet po daily x 3 days 11/18/15   Tysinger, Kermit Balo, PA-C  HYDROcodone-homatropine [facility]) 5-1.5 MG/5ML syrup Take 5 mLs by mouth every 8 (eight) hours as needed for cough. 11/16/15   Tysinger, Kermit Balo, PA-C  pantoprazole (PROTONIX) 40 MG tablet Take 1 tablet (40 mg total) by mouth daily. 11/30/15 11/29/16  Minna Antis, MD    Family History History reviewed. No pertinent family history.  Social History Social History   Tobacco Use  . Smoking status: Never Smoker  . Smokeless tobacco: Never Used  Substance Use Topics  . Alcohol use: No  . Drug use: No     Allergies   Penicillins   Review of  Systems Review of Systems  Constitutional: Negative for appetite change, chills and fever.  HENT: Positive for congestion and sore throat. Negative for ear pain, rhinorrhea, sinus pressure and sinus pain.   Eyes: Negative for redness and visual disturbance.  Respiratory: Positive for cough. Negative for chest tightness, shortness of breath and wheezing.   Cardiovascular: Negative for chest pain and palpitations.  Gastrointestinal: Negative for abdominal pain, constipation, diarrhea, nausea and vomiting.  Genitourinary: Negative for dysuria, frequency and urgency.  Musculoskeletal: Negative for myalgias.  Neurological: Negative for dizziness, weakness and headaches.  Psychiatric/Behavioral: Negative for confusion.  All other systems reviewed and are negative.    Physical Exam Triage Vital Signs ED Triage Vitals  Enc Vitals Group     BP 08/29/20 0825 137/84     Pulse Rate 08/29/20 0825 64     Resp 08/29/20 0825 19     Temp 08/29/20 0825 98.3 F (36.8 C)     Temp src --      SpO2 08/29/20 0825 100 %     Weight --      Height --      Head Circumference --      Peak Flow --      Pain Score 08/29/20 0828 0     Pain Loc --      Pain Edu? --      Excl. in GC? --    No data found.  Updated Vital Signs BP 137/84   Pulse 64   Temp 98.3 F (36.8 C)   Resp 19   SpO2 100%   Visual Acuity Right Eye Distance:   Left Eye Distance:   Bilateral Distance:    Right Eye Near:   Left Eye Near:    Bilateral Near:     Physical Exam Vitals reviewed.  Constitutional:      General: He is not in acute distress.    Appearance: Normal appearance. He is not ill-appearing.  HENT:     Head: Normocephalic and atraumatic.     Right Ear: Hearing, tympanic membrane, ear canal and external ear normal. No swelling or tenderness. There is no impacted cerumen. No mastoid tenderness. Tympanic membrane is not perforated, erythematous, retracted or bulging.     Left Ear: Hearing, tympanic membrane,  ear canal and external ear normal. No swelling or tenderness. There is no impacted cerumen. No mastoid tenderness. Tympanic membrane is not perforated, erythematous, retracted or bulging.     Nose:     Right Sinus: No maxillary sinus tenderness or frontal sinus tenderness.     Left Sinus: No maxillary sinus tenderness or frontal sinus tenderness.     Mouth/Throat:     Mouth: Mucous membranes are moist.     Pharynx: Uvula midline. Posterior oropharyngeal erythema present. No oropharyngeal exudate.     Tonsils: No tonsillar exudate.  Cardiovascular:     Rate and Rhythm: Normal rate and regular rhythm.     Heart sounds: Normal heart sounds.  Pulmonary:     Breath sounds: Normal breath sounds and air entry. No wheezing, rhonchi or rales.  Chest:     Chest wall: No tenderness.  Abdominal:     General: Abdomen is flat. Bowel sounds are normal.     Tenderness: There is no abdominal tenderness. There is no guarding or rebound.  Lymphadenopathy:     Cervical: No cervical adenopathy.  Neurological:     General: No focal deficit present.     Mental Status: He is alert and oriented to person, place, and time.  Psychiatric:        Attention and Perception: Attention and perception normal.        Mood and Affect: Mood and affect normal.        Behavior: Behavior normal. Behavior is cooperative.        Thought Content: Thought content normal.        Judgment: Judgment normal.      UC Treatments / Results  Labs (all labs ordered are listed, but only abnormal results are displayed) Labs Reviewed  SARS CORONAVIRUS 2 (TAT 6-24 HRS)    EKG   Radiology No results found.  Procedures Procedures (including critical care time)  Medications Ordered in UC Medications - No data to display  Initial Impression / Assessment and Plan / UC Course  I have reviewed the triage vital signs and the nursing notes.  Pertinent labs & imaging results that were available during my care of the patient  were reviewed by me and considered in my medical decision making (see chart for details).     Covid and influenza tests sent today. Patient is partially vaccinated for covid-19. Isolation precautions per CDC guidelines until negative result. Symptomatic relief with OTC Mucinex, Nyquil, etc. Return precautions- new/worsening fevers/chills, shortness of breath, chest pain, abd pain, etc.   -Tessalon as needed for cough. Take one pill up to 3x daily (every 8 hours) -Promethazine DM cough syrup  for congestion/cough. This could make you drowsy, so take at night before bed. --For fevers/chills, body aches, headaches- use Tylenol and Ibuprofen. You can alternate these for maximum effect. Use up to 3000mg  Tylenol daily and 3200mg  Ibuprofen daily. Make sure to take ibuprofen with food. Check the bottle of ibuprofen/tylenol for specific dosage instructions.  Final Clinical Impressions(s) / UC Diagnoses   Final diagnoses:  Acute upper respiratory infection     Discharge Instructions     -Tessalon as needed for cough. Take one pill up to 3x daily (every 8 hours) -Promethazine DM cough syrup for congestion/cough. This could make you drowsy, so take at night before bed. --For fevers/chills, body aches, headaches- use Tylenol and Ibuprofen. You can alternate these for maximum effect. Use up to 3000mg  Tylenol daily and 3200mg  Ibuprofen daily. Make sure to take ibuprofen with food. Check the bottle of ibuprofen/tylenol for specific dosage instructions.  We are currently awaiting result of your PCR covid-19 test. This typically comes back in 1-2 days. We'll call you if the result is positive. Otherwise, the result will be sent electronically to your MyChart. You can also call this clinic and ask for your result via telephone.   Please isolate at home while awaiting these results. If your test is positive for Covid-19, continue to isolate at home for 5 days if you have mild symptoms, or a total of 10 days from  symptom onset if you have more severe symptoms. If you quarantine for a shorter period of time (i.e. 5 days), make sure to wear a mask until day 10 of symptoms. Treat your symptoms at home with OTC remedies like tylenol/ibuprofen, mucinex, nyquil, etc. Seek medical attention if you develop high fevers, chest pain, shortness of breath, ear pain, facial pain, etc. Make sure to get up and move around every 2-3 hours while convalescing to help prevent blood clots. Drink plenty of fluids, and rest as much as possible.     ED Prescriptions    Medication Sig Dispense Auth. Provider   promethazine-dextromethorphan (PROMETHAZINE-DM) 6.25-15 MG/5ML syrup Take 5 mLs by mouth 4 (four) times daily as needed for cough. 118 mL , PA-C   benzonatate (TESSALON) 100 MG capsule Take 1 capsule (100 mg total) by mouth every 8 (eight) hours. 21 capsule , PA-C     PDMP not reviewed this encounter.   , PA-C 08/29/20 [phone]
